# Patient Record
Sex: Male | Born: 1980 | Race: White | Hispanic: Yes | Marital: Married | Smoking: Former smoker
Health system: Southern US, Community
[De-identification: ages and names within clinical notes are randomized; demographics above are authoritative.]

---

## 2006-11-24 HISTORY — PX: HERNIA REPAIR: SHX51

## 2007-02-24 ENCOUNTER — Ambulatory Visit (HOSPITAL_COMMUNITY): Admission: RE | Admit: 2007-02-24 | Discharge: 2007-02-24 | Payer: Self-pay | Admitting: Preventative Medicine

## 2007-06-08 ENCOUNTER — Encounter (INDEPENDENT_AMBULATORY_CARE_PROVIDER_SITE_OTHER): Payer: Self-pay | Admitting: Surgery

## 2007-06-08 ENCOUNTER — Ambulatory Visit (HOSPITAL_COMMUNITY): Admission: RE | Admit: 2007-06-08 | Discharge: 2007-06-08 | Payer: Self-pay | Admitting: Surgery

## 2011-02-27 ENCOUNTER — Emergency Department (HOSPITAL_BASED_OUTPATIENT_CLINIC_OR_DEPARTMENT_OTHER)
Admission: EM | Admit: 2011-02-27 | Discharge: 2011-02-27 | Disposition: A | Discharge status: Home / Self Care | Payer: Worker's Compensation | Attending: Emergency Medicine | Admitting: Emergency Medicine

## 2011-02-27 DIAGNOSIS — S61209A Unspecified open wound of unspecified finger without damage to nail, initial encounter: Secondary | ICD-10-CM | POA: Insufficient documentation

## 2011-02-27 DIAGNOSIS — Y9289 Other specified places as the place of occurrence of the external cause: Secondary | ICD-10-CM | POA: Insufficient documentation

## 2011-02-27 DIAGNOSIS — W268XXA Contact with other sharp object(s), not elsewhere classified, initial encounter: Secondary | ICD-10-CM | POA: Insufficient documentation

## 2011-03-07 ENCOUNTER — Emergency Department (HOSPITAL_BASED_OUTPATIENT_CLINIC_OR_DEPARTMENT_OTHER)
Admission: EM | Admit: 2011-03-07 | Discharge: 2011-03-07 | Disposition: A | Discharge status: Home / Self Care | Payer: Worker's Compensation | Attending: Emergency Medicine | Admitting: Emergency Medicine

## 2011-03-07 DIAGNOSIS — Z4802 Encounter for removal of sutures: Secondary | ICD-10-CM | POA: Insufficient documentation

## 2011-04-08 NOTE — Op Note (Signed)
NAME:  Dave Gonzales, Dave Gonzales        ACCOUNT NO.:  [id number]   MEDICAL RECORD NO.:  [id number]          PATIENT TYPE:  AMB   LOCATION:  SDS                          FACILITY:  [facility]   PHYSICIAN:  Thomas A. Cornett, M.D.DATE OF BIRTH:  1981/03/19   DATE OF PROCEDURE:  06/08/2007  DATE OF DISCHARGE:                               OPERATIVE REPORT   PREOPERATIVE DIAGNOSIS:  Abdominal wall mass measuring 2 cm.   POSTOP DIAGNOSES:  1. A 2 cm abdominal wall mass just to the right of the umbilicus.  2. A 4-to-5-mm umbilical hernia.   PROCEDURE:  1. Repair of umbilical hernia.  2. Excision of a 2 cm subcutaneous, anterior, abdominal wall mass just      the right of the umbilicus.   SURGEON:  Maisie Fus A. Cornett, M.D.   ASSISTANT:  None.   ANESTHESIA:  General endotracheal anesthesia with 0.25% Sensorcaine with  epinephrine local.   ESTIMATED BLOOD LOSS:  10 mL.   SPECIMEN:  Fatty mass from anterior abdominal wall fascia to pathology.   INDICATIONS FOR PROCEDURE:  The patient is a 30 year old Hispanic male  whom I saw back in April.  He is complaining of some periumbilical pain,  and had a very hard nodule just to the right of his umbilicus at that  time.  I could not palpate any evidence of hernia with a Valsalva, and  felt this was secondary to trauma since he felt this during work; and  gave a history of trauma to this area.  He presents 2 months later for  excision of this area.  The reason for the delay in him getting  scheduled for surgery is unclear.  I examined him in the holding area;  and the fullness was much less than it was in the office, but was still  present and somewhat uncomfortable.  An informed consent was obtained,  in the office, with the translator as well as today.   DESCRIPTION OF PROCEDURE:  The patient was brought to the operating room  and placed supine.  After induction of general anesthesia, the abdomen  was prepped and draped in a sterile fashion.  The  area of interest was  approximately 1 cm to 2 cm to the right of the umbilicus.  With him  asleep, though, I got a good exam of his umbilicus; and he did now what  was felt to be a 4-to-5-mm umbilical defect consistent with an umbilical  hernia.  Just to the right of his umbilicus was an area of fullness  where the previous mass was palpated which was smaller, but was still  present.  In any event, it was unclear to me exactly what was causing  his pain, but I felt that repair of his hernia since we would be working  in the same area was warranted, since this may also be contributing to  his discomfort.   The incision that I used was to the right along the lateral border of  the umbilicus, along the curve of the umbilicus.  I then elevated the  umbilicus off the fascia with a scalpel.  Indeed, there was a  roughly 5-  mm defect.  There was no fat or anything caught in this, but it was  present.  I felt that repair of this would be warranted; and it was so  small I used a single stitch of #1 Novofil after grabbing both edges of  the fascia and elevating this.  A single stitch was placed carefully to  close this defect.  This was done without difficulty.  The area just  lateral to his umbilicus on the right was somewhat hard and full, almost  consistent with an area of fat necrosis.  I excised this area with the  cautery; and it was on the anterior surface of the abdominal wall  consistent with either a traumatic injury, less likely a lipoma.  Then 2  cm of fatty, somewhat scar tissue removed without difficulty.  I did not  feel any other defect or other abnormality in this area.  This  corresponded to his physical exam earlier.   I then closed the wound using a 3-0 Vicryl to secure the umbilicus down  to the fascia, and then a 4-0 Monocryl to close the skin in a  subcuticular fashion.  I placed a small cotton ball into the defect and  used Steri-Strips.  Dry dressings were applied.  All  final counts of  sponge, needle, and instruments were found to be correct in this portion  of the case.  The patient was awoke, taken to recovery in satisfactory  condition.      Thomas A. Cornett, M.D.  Electronically Signed     TAC/MEDQ  D:  06/08/2007  T:  06/08/2007  Job:  [id number]   cc:   Dorann Lodge

## 2011-09-09 LAB — BASIC METABOLIC PANEL
BUN: 10
GFR calc Af Amer: >60
GFR calc non Af Amer: >60
Potassium: 4.3

## 2011-09-09 LAB — DIFFERENTIAL
Eosinophils Relative: 5
Lymphocytes Relative: 29
Lymphs Abs: 3.1
Neutrophils Relative %: 52

## 2011-09-09 LAB — CBC
HCT: 47.5
Platelets: 278
RBC: 5.4
WBC: 10.5

## 2016-02-27 ENCOUNTER — Emergency Department (HOSPITAL_COMMUNITY)
Admission: EM | Admit: 2016-02-27 | Discharge: 2016-02-27 | Disposition: A | Discharge status: Home / Self Care | Payer: No Typology Code available for payment source | Attending: Emergency Medicine | Admitting: Emergency Medicine

## 2016-02-27 ENCOUNTER — Encounter (HOSPITAL_COMMUNITY): Payer: Self-pay

## 2016-02-27 ENCOUNTER — Emergency Department (HOSPITAL_COMMUNITY): Payer: No Typology Code available for payment source

## 2016-02-27 DIAGNOSIS — M545 Low back pain, unspecified: Secondary | ICD-10-CM

## 2016-02-27 DIAGNOSIS — Y9389 Activity, other specified: Secondary | ICD-10-CM | POA: Insufficient documentation

## 2016-02-27 DIAGNOSIS — S3992XA Unspecified injury of lower back, initial encounter: Secondary | ICD-10-CM | POA: Insufficient documentation

## 2016-02-27 DIAGNOSIS — Y998 Other external cause status: Secondary | ICD-10-CM | POA: Insufficient documentation

## 2016-02-27 DIAGNOSIS — S29001A Unspecified injury of muscle and tendon of front wall of thorax, initial encounter: Secondary | ICD-10-CM | POA: Diagnosis present

## 2016-02-27 DIAGNOSIS — Y92410 Unspecified street and highway as the place of occurrence of the external cause: Secondary | ICD-10-CM | POA: Diagnosis not present

## 2016-02-27 DIAGNOSIS — R0781 Pleurodynia: Secondary | ICD-10-CM

## 2016-02-27 NOTE — ED Notes (Signed)
Patient able to ambulate independentlty

## 2016-02-27 NOTE — Discharge Instructions (Signed)
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
## 2016-02-27 NOTE — ED Notes (Signed)
Registration at bedside.
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
## 2019-08-31 ENCOUNTER — Telehealth: Payer: Self-pay

## 2019-08-31 NOTE — Telephone Encounter (Signed)
Pt. Eligibility is 08/31/2019 till 08/30/2020 with [facility]. Did not fax over pt. [employer]ist application because we still need pt. [employer]. Will leave another note once we receive pt. [employer] [place]

## 2019-09-08 ENCOUNTER — Encounter: Payer: Self-pay | Admitting: Student

## 2019-09-08 ENCOUNTER — Ambulatory Visit: Payer: Self-pay | Admitting: Physician Assistant

## 2019-09-08 ENCOUNTER — Other Ambulatory Visit: Payer: Self-pay

## 2019-09-08 VITALS — BP 120/80 | HR 72 | Temp 99.3°F

## 2019-09-08 DIAGNOSIS — Z131 Encounter for screening for diabetes mellitus: Secondary | ICD-10-CM

## 2019-09-08 DIAGNOSIS — Z8719 Personal history of other diseases of the digestive system: Secondary | ICD-10-CM

## 2019-09-08 DIAGNOSIS — Z789 Other specified health status: Secondary | ICD-10-CM

## 2019-09-08 DIAGNOSIS — Z7689 Persons encountering health services in other specified circumstances: Secondary | ICD-10-CM

## 2019-09-08 DIAGNOSIS — Z13220 Encounter for screening for lipoid disorders: Secondary | ICD-10-CM

## 2019-09-08 DIAGNOSIS — R1905 Periumbilic swelling, mass or lump: Secondary | ICD-10-CM

## 2019-09-08 DIAGNOSIS — R1084 Generalized abdominal pain: Secondary | ICD-10-CM

## 2019-09-08 NOTE — Patient Instructions (Addendum)
cita para el ultrasonido martes 20 de Oct. 2020 a las 8:15am. NADA DE COMER NI BEBER DESPUES DE LAS 12 am LA NOCHE ANTES DE SU CITA.  sacarse sangre en el laboratorio mientras este ayunando

## 2019-09-08 NOTE — Progress Notes (Signed)
BP 120/80   Pulse 72   Temp 99.3 F (37.4 C)   SpO2 97%    Subjective:    Patient ID: Dave Gonzales, male    DOB: 1980/12/23, 38 y.o.   MRN: [id number]  HPI: Dave Gonzales is a 38 y.o. male presenting on 09/08/2019 for New Patient (Initial Visit) (pt states he hasn't ever had a PCP) and Abdominal Pain (for about a yea. pt states when he straightens up after being bent over he feels pain. pt states he also has pain after he eats. pt states it is on his lower abd. pt states he has not tried taking anything to help with pain)   HPI   Pt had a negative covid 19 screening questionnaire   Pt works mfg making containers (gallon size)  Pt has abdominal pain that comes and goes.  abd hurts when he bends and lifts.  He does not have pain when sitting around at home at rest.  Pt had hernia surgery in 2008 in [place].    Relevant past medical, surgical, family and social history reviewed and updated as indicated. Interim medical history since our last visit reviewed. Allergies and medications reviewed and updated.  No current outpatient medications on file.    Review of Systems  Per HPI unless specifically indicated above     Objective:    BP 120/80   Pulse 72   Temp 99.3 F (37.4 C)   SpO2 97%   Wt Readings from Last 3 Encounters:  02/27/16 198 lb (89.8 kg)    Physical Exam Vitals signs reviewed.  Constitutional:      General: He is not in acute distress.    Appearance: He is well-developed. He is not ill-appearing.  HENT:     Head: Normocephalic and atraumatic.  Eyes:     Conjunctiva/sclera: Conjunctivae normal.     Pupils: Pupils are equal, round, and reactive to light.  Neck:     Musculoskeletal: Neck supple.     Thyroid: No thyromegaly.  Cardiovascular:     Rate and Rhythm: Normal rate and regular rhythm.  Pulmonary:     Effort: Pulmonary effort is normal.     Breath sounds: Normal breath sounds. No wheezing or rales.  Abdominal:   General: Abdomen is flat. A surgical scar is present. Bowel sounds are normal. There is no distension.     Palpations: Abdomen is soft. There is no mass.     Tenderness: There is generalized abdominal tenderness. There is no guarding or rebound.     Comments: Pt has very mild generalized tenderness.  There is well healed surgical scar next to the umbilicus.  There is mass approximately 1 cm adjacent to the umbilicus. It is nontender and not red. No drainage.  No hernia appreciated.   Lymphadenopathy:     Cervical: No cervical adenopathy.  Skin:    General: Skin is warm and dry.     Findings: No rash.  Neurological:     Mental Status: He is alert and oriented to person, place, and time.  Psychiatric:        Attention and Perception: Attention normal.        Speech: Speech normal.        Behavior: Behavior normal. Behavior is cooperative.         Assessment & Plan:    Encounter Diagnoses  Name Primary?  . Encounter to establish care Yes  . Generalized abdominal pain   . Periumbilical mass   .  Screening cholesterol level   . Screening for diabetes mellitus   . History of abdominal hernia   . Not proficient in English language      -will Check lipids, a1c, cmp -will get [place] abd.  Discussed with pt that mass is consistency of lipoma.  [place] will help verify this and evaluate for hernia.  -pt is given application for [facility] financial assistance -Will plan to refer to surgery after review of [place] -Pt already got influenza immunization -pt to follow up 1 month (telemedicine appointment).  He is to contact office sooner for worsening or new symtpoms

## 2019-09-13 ENCOUNTER — Other Ambulatory Visit (HOSPITAL_COMMUNITY)
Admission: RE | Admit: 2019-09-13 | Discharge: 2019-09-13 | Disposition: A | Discharge status: Home / Self Care | Payer: Self-pay | Source: Ambulatory Visit | Attending: Physician Assistant | Admitting: Physician Assistant

## 2019-09-13 ENCOUNTER — Ambulatory Visit (HOSPITAL_COMMUNITY)
Admission: RE | Admit: 2019-09-13 | Discharge: 2019-09-13 | Disposition: A | Discharge status: Home / Self Care | Payer: Self-pay | Source: Ambulatory Visit | Attending: Physician Assistant | Admitting: Physician Assistant

## 2019-09-13 ENCOUNTER — Other Ambulatory Visit: Payer: Self-pay

## 2019-09-13 DIAGNOSIS — Z13220 Encounter for screening for lipoid disorders: Secondary | ICD-10-CM | POA: Insufficient documentation

## 2019-09-13 DIAGNOSIS — R1084 Generalized abdominal pain: Secondary | ICD-10-CM | POA: Insufficient documentation

## 2019-09-13 DIAGNOSIS — Z131 Encounter for screening for diabetes mellitus: Secondary | ICD-10-CM | POA: Insufficient documentation

## 2019-09-13 DIAGNOSIS — R1905 Periumbilic swelling, mass or lump: Secondary | ICD-10-CM | POA: Insufficient documentation

## 2019-09-13 DIAGNOSIS — Z8719 Personal history of other diseases of the digestive system: Secondary | ICD-10-CM | POA: Insufficient documentation

## 2019-09-13 LAB — COMPREHENSIVE METABOLIC PANEL
ALT: 58 U/L — ABNORMAL HIGH (ref 0–44)
AST: 37 U/L (ref 15–41)
Albumin: 4.1 g/dL (ref 3.5–5.0)
Alkaline Phosphatase: 75 U/L (ref 38–126)
Anion gap: 9 (ref 5–15)
BUN: 18 mg/dL (ref 6–20)
CO2: 26 mmol/L (ref 22–32)
Calcium: 9 mg/dL (ref 8.9–10.3)
Chloride: 104 mmol/L (ref 98–111)
Creatinine, Ser: 0.6 mg/dL — ABNORMAL LOW (ref 0.61–1.24)
GFR calc Af Amer: >60 mL/min (ref >60)
GFR calc non Af Amer: >60 mL/min (ref >60)
Glucose, Bld: 103 mg/dL — ABNORMAL HIGH (ref 70–99)
Potassium: 3.7 mmol/L (ref 3.5–5.1)
Sodium: 139 mmol/L (ref 135–145)
Total Bilirubin: 1.5 mg/dL — ABNORMAL HIGH (ref 0.3–1.2)
Total Protein: 7.2 g/dL (ref 6.5–8.1)

## 2019-09-13 LAB — HEMOGLOBIN A1C
Hgb A1c MFr Bld: 6.1 % — ABNORMAL HIGH (ref 4.8–5.6)
Mean Plasma Glucose: 128.37 mg/dL

## 2019-09-13 LAB — LIPID PANEL
Cholesterol: 174 mg/dL (ref 0–200)
HDL: 43 mg/dL (ref >40)
LDL Cholesterol: 87 mg/dL (ref 0–99)
Total CHOL/HDL Ratio: 4 RATIO
Triglycerides: 218 mg/dL — ABNORMAL HIGH (ref <150)
VLDL: 44 mg/dL — ABNORMAL HIGH (ref 0–40)

## 2019-10-10 ENCOUNTER — Ambulatory Visit: Payer: Self-pay | Admitting: Physician Assistant

## 2020-08-30 ENCOUNTER — Ambulatory Visit: Payer: Self-pay | Admitting: Physician Assistant

## 2020-09-13 ENCOUNTER — Ambulatory Visit: Payer: Self-pay | Admitting: Physician Assistant

## 2020-09-17 ENCOUNTER — Emergency Department

## 2020-09-17 ENCOUNTER — Emergency Department
Admission: EM | Admit: 2020-09-17 | Discharge: 2020-09-17 | Disposition: A | Discharge status: Home / Self Care | Attending: Emergency Medicine | Admitting: Emergency Medicine

## 2020-09-17 ENCOUNTER — Other Ambulatory Visit: Payer: Self-pay

## 2020-09-17 DIAGNOSIS — Z23 Encounter for immunization: Secondary | ICD-10-CM | POA: Diagnosis not present

## 2020-09-17 DIAGNOSIS — S62631B Displaced fracture of distal phalanx of left index finger, initial encounter for open fracture: Secondary | ICD-10-CM

## 2020-09-17 DIAGNOSIS — W231XXA Caught, crushed, jammed, or pinched between stationary objects, initial encounter: Secondary | ICD-10-CM | POA: Diagnosis not present

## 2020-09-17 DIAGNOSIS — Z87891 Personal history of nicotine dependence: Secondary | ICD-10-CM | POA: Insufficient documentation

## 2020-09-17 DIAGNOSIS — S62631A Displaced fracture of distal phalanx of left index finger, initial encounter for closed fracture: Secondary | ICD-10-CM | POA: Insufficient documentation

## 2020-09-17 DIAGNOSIS — S61211A Laceration without foreign body of left index finger without damage to nail, initial encounter: Secondary | ICD-10-CM | POA: Diagnosis not present

## 2020-09-17 DIAGNOSIS — S67191A Crushing injury of left index finger, initial encounter: Secondary | ICD-10-CM | POA: Diagnosis present

## 2020-09-17 DIAGNOSIS — Y990 Civilian activity done for income or pay: Secondary | ICD-10-CM | POA: Insufficient documentation

## 2020-09-17 DIAGNOSIS — S6710XA Crushing injury of unspecified finger(s), initial encounter: Secondary | ICD-10-CM

## 2020-09-17 DIAGNOSIS — S61321A Laceration with foreign body of left index finger with damage to nail, initial encounter: Secondary | ICD-10-CM

## 2020-09-17 MED ORDER — LIDOCAINE HCL (PF) 1 % IJ SOLN
5.0000 mL | Freq: Once | INTRAMUSCULAR | Status: AC
  Administered 2020-09-17: 5 mL via INTRADERMAL
  Filled 2020-09-17: qty 5

## 2020-09-17 MED ORDER — TETANUS-DIPHTH-ACELL PERTUSSIS 5-2.5-18.5 LF-MCG/0.5 IM SUSP
INTRAMUSCULAR | Status: AC
  Filled 2020-09-17: qty 0.5

## 2020-09-17 MED ORDER — MELOXICAM 15 MG PO TABS: 15.0000 mg | ORAL_TABLET | Freq: Every day | ORAL | 0 refills | Status: DC

## 2020-09-17 MED ORDER — BUPIVACAINE HCL (PF) 0.5 % IJ SOLN
10.0000 mL | Freq: Once | INTRAMUSCULAR | Status: AC
  Administered 2020-09-17: 10 mL
  Filled 2020-09-17: qty 10

## 2020-09-17 MED ORDER — OXYCODONE-ACETAMINOPHEN 10-325 MG PO TABS: 1.0000 | ORAL_TABLET | Freq: Four times a day (QID) | ORAL | 0 refills | Status: DC | PRN

## 2020-09-17 MED ORDER — SULFAMETHOXAZOLE-TRIMETHOPRIM 800-160 MG PO TABS: 1.0000 | ORAL_TABLET | Freq: Two times a day (BID) | ORAL | 0 refills | Status: DC

## 2020-09-17 MED ORDER — TETANUS-DIPHTH-ACELL PERTUSSIS 5-2.5-18.5 LF-MCG/0.5 IM SUSY
0.5000 mL | PREFILLED_SYRINGE | Freq: Once | INTRAMUSCULAR | Status: AC
  Administered 2020-09-17: 0.5 mL via INTRAMUSCULAR

## 2020-09-17 MED ORDER — CEPHALEXIN 500 MG PO CAPS: 500.0000 mg | ORAL_CAPSULE | Freq: Three times a day (TID) | ORAL | 0 refills | Status: AC

## 2020-09-17 NOTE — ED Provider Notes (Signed)
[facility] Emergency Department Provider Note  ____________________________________________  Time seen: Approximately 1:46 PM  I have reviewed the triage vital signs and the nursing notes.   HISTORY  Chief Complaint Finger Injury   HPI Dave Gonzales is a 39 y.o. male presenting to the emergency department after accidentally getting his left index finger crushed in machinery while at work. Obvious deformity. Laceration present as well. Bleeding is well controlled. Tdap is not current   History reviewed. No pertinent past medical history.  There are no problems to display for this patient.   Past Surgical History:  Procedure Laterality Date  . HERNIA REPAIR  2008    Prior to Admission medications   Medication Sig Start Date End Date Taking? Authorizing Provider  cephALEXin (KEFLEX) 500 MG capsule Take 1 capsule (500 mg total) by mouth 3 (three) times daily for 10 days. 09/17/20 09/27/20  Emberlynn Riggan, Rulon Eisenmenger B, FNP  meloxicam (MOBIC) 15 MG tablet Take 1 tablet (15 mg total) by mouth daily. 09/17/20   Mcihael Hinderman, Rulon Eisenmenger B, FNP  oxyCODONE-acetaminophen (PERCOCET) 10-325 MG tablet Take 1 tablet by mouth every 6 (six) hours as needed for pain. 09/17/20 09/17/21  Josedejesus Marcum, Rulon Eisenmenger B, FNP  sulfamethoxazole-trimethoprim (BACTRIM DS) 800-160 MG tablet Take 1 tablet by mouth 2 (two) times daily. 09/17/20   Chinita Pester, FNP    Allergies Patient has no known allergies.  Family History  Problem Relation Age of Onset  . Hyperlipidemia Mother   . Heart attack Father   . Diabetes Father     Social History Social History   Tobacco Use  . Smoking status: Former Smoker    Quit date: 06/25/2019    Years since quitting: 1.2  . Smokeless tobacco: Never Used  . Tobacco comment: previously social smoker.  quit smoking 06-25-2019  Vaping Use  . Vaping Use: Never used  Substance Use Topics  . Alcohol use: Yes    Comment: drinks 1-2 beer every other week  . Drug  use: Never    Review of Systems  Constitutional: Negative for fever. Respiratory: Negative for cough or shortness of breath.  Musculoskeletal: Negative for myalgias Skin: Positive for laceration over the left index finger. Neurological: Negative for numbness or paresthesias. ____________________________________________   PHYSICAL EXAM:  VITAL SIGNS: ED Triage Vitals  Enc Vitals Group     BP 09/17/20 1119 132/79     Pulse Rate 09/17/20 1119 88     Resp 09/17/20 1119 16     Temp 09/17/20 1119 99.5 F (37.5 C)     Temp Source 09/17/20 1119 Oral     SpO2 09/17/20 1119 (!) 79 %     Weight --      Height --      Head Circumference --      Peak Flow --      Pain Score 09/17/20 1330 0     Pain Loc --      Pain Edu? --      Excl. in GC? --      Constitutional: Well appearing. Eyes: Conjunctivae are clear without discharge or drainage. Nose: No rhinorrhea noted. Mouth/Throat: Airway is patent.  Neck: No stridor. Unrestricted range of motion observed. Cardiovascular: Capillary refill is <3 seconds.  Respiratory: Respirations are even and unlabored.. Musculoskeletal: Deformity of the left index finger at the distal tuft. No bony exposure. Neurologic: Awake, alert, and oriented x 4.  Skin: Laceration over the distal index finger. Fingernail displaced at the base on the medial side.  ____________________________________________   LABS (all labs ordered are listed, but only abnormal results are displayed)  Labs Reviewed - No data to display ____________________________________________  EKG  Not indicated. ____________________________________________  RADIOLOGY  Displaced, comminuted distal tuft fracture left index finger.  Image viewed by me. ____________________________________________   PROCEDURES  .Marland KitchenLaceration Repair  Date/Time: 09/17/2020 2:07 PM Performed by: Chinita Pester, FNP Authorized by: Chinita Pester, FNP   Consent:    Consent obtained:   Verbal   Consent given by:  Patient   Risks discussed:  Need for additional repair, infection and poor wound healing Anesthesia (see MAR for exact dosages):    Anesthesia method:  Nerve block   Block needle gauge:  25 G   Block anesthetic:  Bupivacaine 0.5% w/o epi and lidocaine 1% w/o epi   Block injection procedure:  Introduced needle   Block outcome:  Anesthesia achieved Laceration details:    Location:  Finger   Finger location:  L index finger   Length (cm):  3.5 Repair type:    Repair type:  Intermediate Pre-procedure details:    Preparation:  Patient was prepped and draped in usual sterile fashion Exploration:    Contaminated: yes   Treatment:    Area cleansed with:  Betadine and saline   Irrigation method:  Syringe Skin repair:    Repair method:  Sutures   Suture size:  4-0   Suture material:  Nylon   Suture technique:  Simple interrupted   Number of sutures:  8 Approximation:    Approximation:  Close Post-procedure details:    Dressing:  Non-adherent dressing, sterile dressing and splint for protection   Patient tolerance of procedure:  Tolerated well, no immediate complications Comments:     Base of nail reinserted and secured with suture.   ____________________________________________   INITIAL IMPRESSION / ASSESSMENT AND PLAN / ED COURSE  Dave Gonzales is a 39 y.o. male presenting to the emergency department for treatment and evaluation after his finger was smashed in a piece of machinery while at work this morning.  See HPI for further details.  Wound cleaned and repaired as described above.  Splint was applied.  Wound care discussed.  Patient is to call Dr. Binnie Rail office this afternoon to request an appointment. He will be double covered with Keflex and Bactrim and given pain meds and anti-inflammatory.  Tdap booster given. He to return to the ER for concerns if unable to see PCP or orthopedics.   Medications  Tdap (BOOSTRIX) 5-2.5-18.5 LF-MCG/0.5  injection ( Intramuscular Not Given 09/17/20 1206)  lidocaine (PF) (XYLOCAINE) 1 % injection 5 mL (5 mLs Intradermal Given 09/17/20 1202)  bupivacaine (MARCAINE) 0.5 % injection 10 mL (10 mLs Infiltration Given 09/17/20 1202)  Tdap (BOOSTRIX) injection 0.5 mL (0.5 mLs Intramuscular Given 09/17/20 1204)     Pertinent labs & imaging results that were available during my care of the patient were reviewed by me and considered in my medical decision making (see chart for details).  ____________________________________________   FINAL CLINICAL IMPRESSION(S) / ED DIAGNOSES  Final diagnoses:  Crushing injury of finger, initial encounter  Laceration of left index finger with foreign body and damage to nail, initial encounter  Open displaced fracture of distal phalanx of left index finger, initial encounter    ED Discharge Orders         Ordered    oxyCODONE-acetaminophen (PERCOCET) 10-325 MG tablet  Every 6 hours PRN        09/17/20 1301  cephALEXin (KEFLEX) 500 MG capsule  3 times daily        09/17/20 1301    sulfamethoxazole-trimethoprim (BACTRIM DS) 800-160 MG tablet  2 times daily        09/17/20 1301    meloxicam (MOBIC) 15 MG tablet  Daily        09/17/20 1301           Note:  This document was prepared using Dragon voice recognition software and may include unintentional dictation errors.   Chinita Pester, FNP 09/17/20 1417    Minna Antis, MD 09/17/20 1442

## 2020-09-17 NOTE — ED Notes (Signed)
Spoke with [employer] about the need for a ETOCH blood draw. Barbara Cower said pt does not need this despite what profile says.  Barbara Cower asked to be called to pick up the pt.   lw edt

## 2020-09-17 NOTE — ED Triage Notes (Signed)
Pt states he got his left pointer finger crushed in a machine today while at work, deformity noted with controlled bleeding.

## 2020-09-17 NOTE — Discharge Instructions (Signed)
Please call Dr. Binnie Rail office to schedule an appointment.  Take the antibiotic as prescribed and until finished.   Take the pain medication for severe pain. Do not drive or use machinery while taking it as it will cause drowsiness or dizziness.  Return to the ER for symptoms of concern if unable to see PCP or orthopedics.

## 2020-09-20 ENCOUNTER — Ambulatory Visit: Payer: Self-pay | Admitting: Physician Assistant

## 2020-10-01 ENCOUNTER — Encounter: Payer: Self-pay | Admitting: Physician Assistant

## 2020-10-15 ENCOUNTER — Encounter: Payer: Self-pay | Admitting: Physician Assistant

## 2020-10-15 ENCOUNTER — Ambulatory Visit: Payer: Self-pay | Admitting: Physician Assistant

## 2020-10-15 VITALS — BP 122/80 | HR 88 | Temp 97.3°F | Ht 66.5 in | Wt 198.0 lb

## 2020-10-15 DIAGNOSIS — R7303 Prediabetes: Secondary | ICD-10-CM

## 2020-10-15 DIAGNOSIS — Z789 Other specified health status: Secondary | ICD-10-CM

## 2020-10-15 DIAGNOSIS — R1030 Lower abdominal pain, unspecified: Secondary | ICD-10-CM

## 2020-10-15 DIAGNOSIS — E785 Hyperlipidemia, unspecified: Secondary | ICD-10-CM

## 2020-10-15 DIAGNOSIS — R109 Unspecified abdominal pain: Secondary | ICD-10-CM

## 2020-10-15 DIAGNOSIS — R102 Pelvic and perineal pain: Secondary | ICD-10-CM

## 2020-10-15 NOTE — Progress Notes (Signed)
BP 122/80   Pulse 88   Temp (!) 97.3 F (36.3 C)   Ht 5' 6.5" (1.689 m)   Wt 198 lb (89.8 kg)   SpO2 96%   BMI 31.48 kg/m    Subjective:    Patient ID: Dave Gonzales, male    DOB: Nov 08, 1981, 39 y.o.   MRN: [id number]  HPI: Dave Gonzales is a 39 y.o. male presenting on 10/15/2020 for New Patient (Initial Visit) (pt is working at [employer]) and Mass (pain R side rib. pt states he feels a raised area when he rubs i and it hurts. pt states he has felt it for about 2-3 months. pt started noticing it after falling at work)   HPI   Pt had a negative covid 19 screening questionnaire.   Chief Complaint  Patient presents with  . New Patient (Initial Visit)    pt is working at [employer]  . Mass    pain R side rib. pt states he feels a raised area when he rubs i and it hurts. pt states he has felt it for about 2-3 months. pt started noticing it after falling at work     Pt is 39yoM who presents to re-establish care.  He was last seen here 09/08/2019.  He was a no-show to his follow up in November.  He recently had an injury to his finger. He is still under care with orthopedics for his finger crush injury .  He hurts R side/rib and abdomen.  He says it is the same as he had when seen here 08/2019.  He had imaging but never returned to office.    He says his abdominal pain is worse than his rib.  He says he has No pain when sitting.   He experiences Pain with leaning forward, laying back, standing up.    He is Eating okay.  His BMs are normal and regular.        Relevant past medical, surgical, family and social history reviewed and updated as indicated. Interim medical history since our last visit reviewed. Allergies and medications reviewed and updated.   No current outpatient medications on file.     Review of Systems  Per HPI unless specifically indicated above     Objective:    BP 122/80   Pulse 88   Temp (!) 97.3 F (36.3 C)   Ht 5'  6.5" (1.689 m)   Wt 198 lb (89.8 kg)   SpO2 96%   BMI 31.48 kg/m   Wt Readings from Last 3 Encounters:  10/15/20 198 lb (89.8 kg)  02/27/16 198 lb (89.8 kg)    Physical Exam Vitals reviewed.  Constitutional:      General: He is not in acute distress.    Appearance: He is well-developed. He is not ill-appearing.  HENT:     Head: Normocephalic and atraumatic.  Eyes:     Conjunctiva/sclera: Conjunctivae normal.     Pupils: Pupils are equal, round, and reactive to light.  Neck:     Thyroid: No thyromegaly.  Cardiovascular:     Rate and Rhythm: Normal rate and regular rhythm.  Pulmonary:     Effort: Pulmonary effort is normal.     Breath sounds: Normal breath sounds. No wheezing or rales.  Abdominal:     General: Bowel sounds are normal.     Palpations: Abdomen is soft. There is no mass.     Tenderness: There is abdominal tenderness in the suprapubic area.  Hernia: There is no hernia in the left inguinal area or right inguinal area.    Musculoskeletal:     Cervical back: Neck supple.     Right lower leg: No edema.     Left lower leg: No edema.  Lymphadenopathy:     Cervical: No cervical adenopathy.     Lower Body: No right inguinal adenopathy. No left inguinal adenopathy.  Skin:    General: Skin is warm and dry.     Findings: No rash.  Neurological:     Mental Status: He is alert and oriented to person, place, and time.     Motor: No weakness or tremor.     Gait: Gait is intact.  Psychiatric:        Attention and Perception: Attention normal.        Speech: Speech normal.     Comments: Pt becomes angry when asked to move on from talking about his finger injury for which he is already being treated.             Assessment & Plan:    Encounter Diagnoses  Name Primary?  . Not proficient in English language Yes  . Suprapubic abdominal pain   . Prediabetes   . Hyperlipidemia, unspecified hyperlipidemia type   . Lower abdominal pain      -will Update  labs -Will order CT to check for hernia/mass as [place] was unremarkable (due to technical difficulties with order entry, nurse will enter after discussing with helpline) -pt is given CAFA/application for [employer] financial assistance -pt to follow up 1 month to review labs, imaging and discuss ribs.  He is to contact office sooner prn

## 2020-10-16 ENCOUNTER — Other Ambulatory Visit (HOSPITAL_COMMUNITY): Payer: Self-pay | Admitting: Physician Assistant

## 2020-10-16 ENCOUNTER — Other Ambulatory Visit: Payer: Self-pay | Admitting: Physician Assistant

## 2020-10-16 DIAGNOSIS — R109 Unspecified abdominal pain: Secondary | ICD-10-CM

## 2020-10-23 ENCOUNTER — Other Ambulatory Visit (HOSPITAL_COMMUNITY)
Admission: RE | Admit: 2020-10-23 | Discharge: 2020-10-23 | Disposition: A | Discharge status: Home / Self Care | Payer: Self-pay | Source: Ambulatory Visit | Attending: Physician Assistant | Admitting: Physician Assistant

## 2020-10-23 DIAGNOSIS — R7303 Prediabetes: Secondary | ICD-10-CM | POA: Insufficient documentation

## 2020-10-23 DIAGNOSIS — E785 Hyperlipidemia, unspecified: Secondary | ICD-10-CM | POA: Insufficient documentation

## 2020-10-23 LAB — HEMOGLOBIN A1C
Hgb A1c MFr Bld: 5.8 % — ABNORMAL HIGH (ref 4.8–5.6)
Mean Plasma Glucose: 119.76 mg/dL

## 2020-10-23 LAB — COMPREHENSIVE METABOLIC PANEL
ALT: 43 U/L (ref 0–44)
AST: 26 U/L (ref 15–41)
Albumin: 4 g/dL (ref 3.5–5.0)
Alkaline Phosphatase: 65 U/L (ref 38–126)
Anion gap: 7 (ref 5–15)
BUN: 13 mg/dL (ref 6–20)
CO2: 27 mmol/L (ref 22–32)
Calcium: 9 mg/dL (ref 8.9–10.3)
Chloride: 104 mmol/L (ref 98–111)
Creatinine, Ser: 0.66 mg/dL (ref 0.61–1.24)
GFR, Estimated: >60 mL/min (ref >60)
Glucose, Bld: 106 mg/dL — ABNORMAL HIGH (ref 70–99)
Potassium: 3.9 mmol/L (ref 3.5–5.1)
Sodium: 138 mmol/L (ref 135–145)
Total Bilirubin: 1.1 mg/dL (ref 0.3–1.2)
Total Protein: 7.1 g/dL (ref 6.5–8.1)

## 2020-10-23 LAB — LIPID PANEL
Cholesterol: 169 mg/dL (ref 0–200)
HDL: 48 mg/dL (ref >40)
LDL Cholesterol: 109 mg/dL — ABNORMAL HIGH (ref 0–99)
Total CHOL/HDL Ratio: 3.5 RATIO
Triglycerides: 60 mg/dL (ref <150)
VLDL: 12 mg/dL (ref 0–40)

## 2020-11-14 ENCOUNTER — Other Ambulatory Visit: Payer: Self-pay

## 2020-11-14 ENCOUNTER — Ambulatory Visit (HOSPITAL_COMMUNITY)
Admission: RE | Admit: 2020-11-14 | Discharge: 2020-11-14 | Disposition: A | Discharge status: Home / Self Care | Payer: Self-pay | Source: Ambulatory Visit | Attending: Physician Assistant | Admitting: Physician Assistant

## 2020-11-14 DIAGNOSIS — R109 Unspecified abdominal pain: Secondary | ICD-10-CM

## 2020-11-14 MED ORDER — BARIUM SULFATE 2.1 % PO SUSP
ORAL | Status: AC
  Filled 2020-11-14: qty 2

## 2020-11-26 ENCOUNTER — Ambulatory Visit: Payer: Self-pay | Admitting: Physician Assistant

## 2020-12-11 ENCOUNTER — Ambulatory Visit (HOSPITAL_COMMUNITY): Payer: Self-pay

## 2020-12-20 ENCOUNTER — Other Ambulatory Visit: Payer: Self-pay

## 2020-12-20 ENCOUNTER — Ambulatory Visit: Payer: Self-pay | Admitting: Physician Assistant

## 2020-12-20 ENCOUNTER — Ambulatory Visit (HOSPITAL_COMMUNITY)
Admission: RE | Admit: 2020-12-20 | Discharge: 2020-12-20 | Disposition: A | Discharge status: Home / Self Care | Payer: Self-pay | Source: Ambulatory Visit | Attending: Physician Assistant | Admitting: Physician Assistant

## 2020-12-20 DIAGNOSIS — R109 Unspecified abdominal pain: Secondary | ICD-10-CM | POA: Insufficient documentation

## 2020-12-24 ENCOUNTER — Ambulatory Visit: Payer: Self-pay | Admitting: Physician Assistant

## 2020-12-31 ENCOUNTER — Encounter: Payer: Self-pay | Admitting: Physician Assistant

## 2020-12-31 ENCOUNTER — Ambulatory Visit: Payer: Self-pay | Admitting: Physician Assistant

## 2020-12-31 DIAGNOSIS — R7303 Prediabetes: Secondary | ICD-10-CM

## 2020-12-31 DIAGNOSIS — Z789 Other specified health status: Secondary | ICD-10-CM

## 2020-12-31 NOTE — Progress Notes (Unsigned)
There were no vitals taken for this visit.   Subjective:    Patient ID: Dave Gonzales, male    DOB: 09-Nov-1981, 40 y.o.   MRN: [id number]  HPI: Dave Gonzales is a 40 y.o. male presenting on 12/31/2020 for No chief complaint on file.   HPI   This is a telemedicine appointment through [employer] due to coronavirus pandemic.  I connected with  Dave Gonzales on 01/01/21 by a video enabled telemedicine application and verified that I am speaking with the correct person using two identifiers.   I discussed the limitations of evaluation and management by telemedicine. The patient expressed understanding and agreed to proceed.  Pt is in his parked vehicle.  Provider and translator are at office.   Pt is 39yoM with appointment today to review test results.  He says he Got covid vaccination  He is Still seeing doctor about his finger injury.  He says surgery is planned.        Relevant past medical, surgical, family and social history reviewed and updated as indicated. Interim medical history since our last visit reviewed. Allergies and medications reviewed and updated.    No current outpatient medications on file.     Review of Systems  Per HPI unless specifically indicated above     Objective:    There were no vitals taken for this visit.  Wt Readings from Last 3 Encounters:  10/15/20 198 lb (89.8 kg)  02/27/16 198 lb (89.8 kg)    Physical Exam Constitutional:      General: He is not in acute distress.    Appearance: He is not ill-appearing.  HENT:     Head: Normocephalic and atraumatic.  Pulmonary:     Effort: No respiratory distress.  Neurological:     Mental Status: He is alert and oriented to person, place, and time.  Psychiatric:        Attention and Perception: Attention normal.        Speech: Speech normal.        Behavior: Behavior is cooperative.     Results for orders placed or performed during the hospital encounter of 10/23/20   Comprehensive metabolic panel  Result Value Ref Range   Sodium 138 135 - 145 mmol/L   Potassium 3.9 3.5 - 5.1 mmol/L   Chloride 104 98 - 111 mmol/L   CO2 27 22 - 32 mmol/L   Glucose, Bld 106 (H) 70 - 99 mg/dL   BUN 13 6 - 20 mg/dL   Creatinine, Ser 1.63 0.61 - 1.24 mg/dL   Calcium 9.0 8.9 - 84.6 mg/dL   Total Protein 7.1 6.5 - 8.1 g/dL   Albumin 4.0 3.5 - 5.0 g/dL   AST 26 15 - 41 U/L   ALT 43 0 - 44 U/L   Alkaline Phosphatase 65 38 - 126 U/L   Total Bilirubin 1.1 0.3 - 1.2 mg/dL   GFR, Estimated >65 >99 mL/min   Anion gap 7 5 - 15  Hemoglobin A1c  Result Value Ref Range   Hgb A1c MFr Bld 5.8 (H) 4.8 - 5.6 %   Mean Plasma Glucose 119.76 mg/dL  Lipid panel  Result Value Ref Range   Cholesterol 169 0 - 200 mg/dL   Triglycerides 60 <357 mg/dL   HDL 48 >01 mg/dL   Total CHOL/HDL Ratio 3.5 RATIO   VLDL 12 0 - 40 mg/dL   LDL Cholesterol 779 (H) 0 - 99 mg/dL      Assessment & Plan:  Encounter Diagnoses  Name Primary?  . Not proficient in English language Yes  . Prediabetes      -reviewed labs and CT with pt -pt to follow up October.  He is to contact office sooner prn

## 2021-01-01 ENCOUNTER — Encounter: Payer: Self-pay | Admitting: Physician Assistant

## 2021-09-23 ENCOUNTER — Ambulatory Visit: Payer: Self-pay | Admitting: Physician Assistant

## 2021-11-13 IMAGING — CR DG FINGER INDEX 2+V*L*
1 series · 3 of 3 positions shown · non-contrast
Comparison: None.

CLINICAL DATA: Finger injury with pain

EXAM:
LEFT INDEX FINGER 2+V

[Series 1: dg finger index left · 0.14mm/px · 3 of 3 slices shown]
[im 1/3]
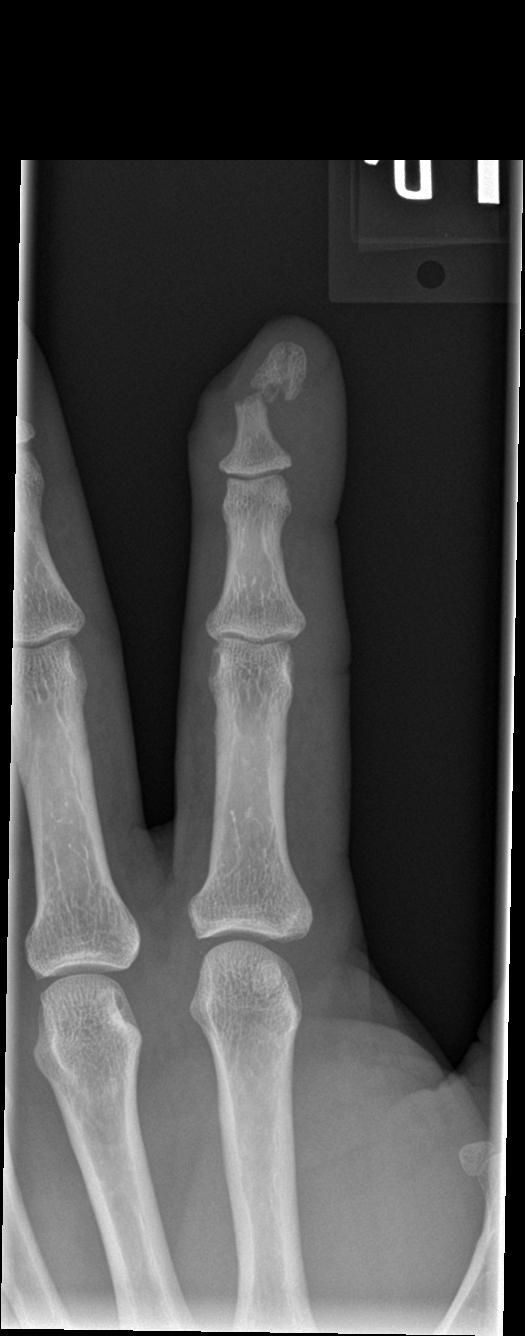
[im 2/3]
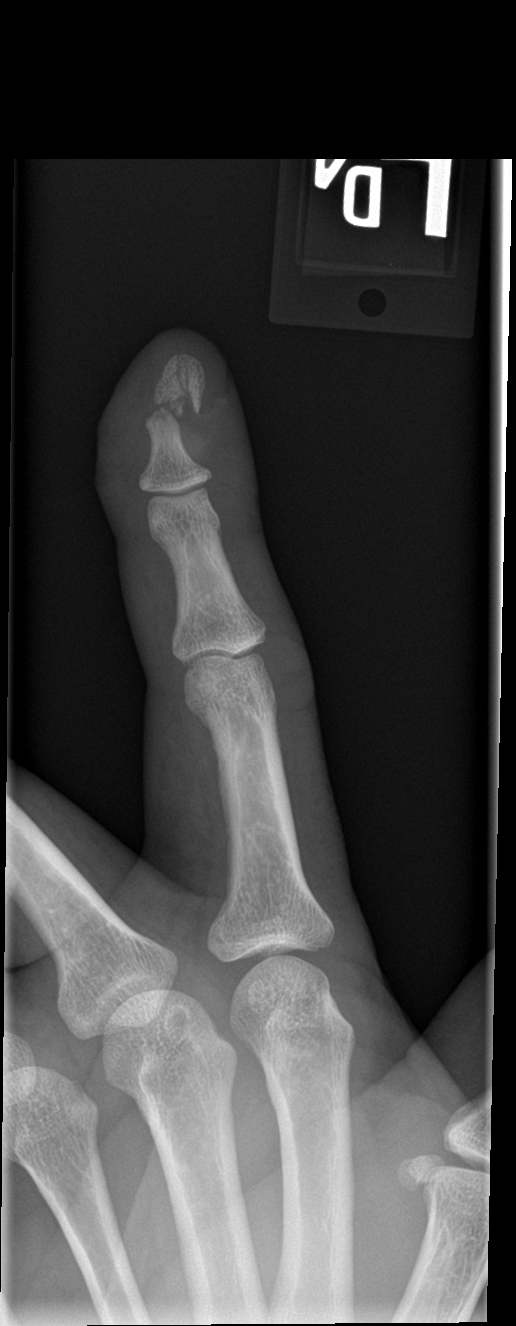
[im 3/3]
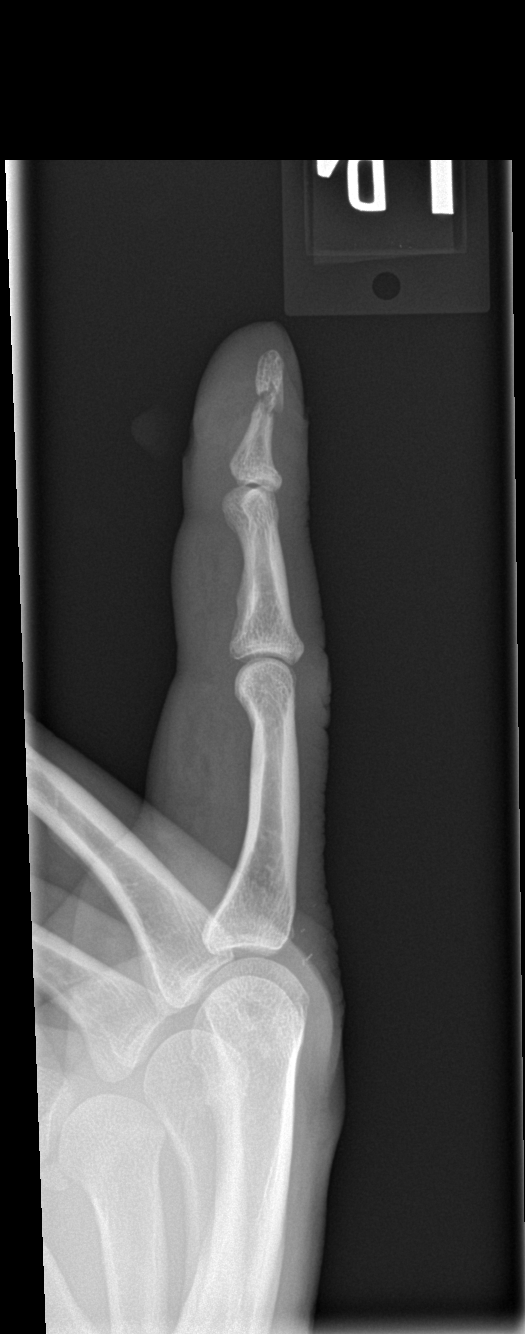

[3 of 3 positions shown; findings below may reference images not displayed]

FINDINGS: Comminuted and displaced fracture of the index finger. No
dislocation or opaque foreign body at the level of fracture. Tiny
punctate foreign body seen dorsal to the first MCP joint on the
lateral view, age-indeterminate
IMPRESSION: 1. Comminuted and displaced tuft fracture of the index finger.
2. Punctate foreign bodies near the index finger MCP joint on the
lateral view, age-indeterminate.

## 2024-08-23 ENCOUNTER — Other Ambulatory Visit: Payer: Self-pay

## 2024-08-23 ENCOUNTER — Encounter (HOSPITAL_COMMUNITY): Payer: Self-pay

## 2024-08-23 ENCOUNTER — Emergency Department (HOSPITAL_COMMUNITY): Payer: Self-pay

## 2024-08-23 ENCOUNTER — Emergency Department (HOSPITAL_COMMUNITY)
Admission: EM | Admit: 2024-08-23 | Discharge: 2024-08-23 | Disposition: A | Discharge status: Home / Self Care | Payer: Self-pay | Attending: Emergency Medicine | Admitting: Emergency Medicine

## 2024-08-23 DIAGNOSIS — E871 Hypo-osmolality and hyponatremia: Secondary | ICD-10-CM | POA: Insufficient documentation

## 2024-08-23 DIAGNOSIS — K602 Anal fissure, unspecified: Secondary | ICD-10-CM | POA: Insufficient documentation

## 2024-08-23 DIAGNOSIS — R739 Hyperglycemia, unspecified: Secondary | ICD-10-CM | POA: Insufficient documentation

## 2024-08-23 DIAGNOSIS — R7401 Elevation of levels of liver transaminase levels: Secondary | ICD-10-CM | POA: Insufficient documentation

## 2024-08-23 LAB — CBC
HCT: 45.5 % (ref 39.0–52.0)
Hemoglobin: 15.7 g/dL (ref 13.0–17.0)
MCH: 30.5 pg (ref 26.0–34.0)
MCHC: 34.5 g/dL (ref 30.0–36.0)
MCV: 88.5 fL (ref 80.0–100.0)
Platelets: 242 K/uL (ref 150–400)
RBC: 5.14 MIL/uL (ref 4.22–5.81)
RDW: 12.1 % (ref 11.5–15.5)
WBC: 8.4 K/uL (ref 4.0–10.5)
nRBC: 0 % (ref 0.0–0.2)

## 2024-08-23 LAB — COMPREHENSIVE METABOLIC PANEL WITH GFR
ALT: 75 U/L — ABNORMAL HIGH (ref 0–44)
AST: 48 U/L — ABNORMAL HIGH (ref 15–41)
Albumin: 3.3 g/dL — ABNORMAL LOW (ref 3.5–5.0)
Alkaline Phosphatase: 86 U/L (ref 38–126)
Anion gap: 11 (ref 5–15)
BUN: 11 mg/dL (ref 6–20)
CO2: 23 mmol/L (ref 22–32)
Calcium: 8.8 mg/dL — ABNORMAL LOW (ref 8.9–10.3)
Chloride: 99 mmol/L (ref 98–111)
Creatinine, Ser: 0.71 mg/dL (ref 0.61–1.24)
GFR, Estimated: >60 mL/min (ref >60)
Glucose, Bld: 209 mg/dL — ABNORMAL HIGH (ref 70–99)
Potassium: 3.8 mmol/L (ref 3.5–5.1)
Sodium: 133 mmol/L — ABNORMAL LOW (ref 135–145)
Total Bilirubin: 0.6 mg/dL (ref 0.0–1.2)
Total Protein: 6.5 g/dL (ref 6.5–8.1)

## 2024-08-23 MED ORDER — HYDROCODONE-ACETAMINOPHEN 5-325 MG PO TABS
1.0000 | ORAL_TABLET | Freq: Four times a day (QID) | ORAL | 0 refills | Status: AC | PRN
Start: 2024-08-23 — End: ?

## 2024-08-23 MED ORDER — MORPHINE SULFATE (PF) 4 MG/ML IV SOLN
4.0000 mg | Freq: Once | INTRAVENOUS | Status: AC
Start: 2024-08-23 — End: 2024-08-23
  Administered 2024-08-23: 4 mg via INTRAVENOUS
  Filled 2024-08-23: qty 1

## 2024-08-23 MED ORDER — IOHEXOL 350 MG/ML SOLN
75.0000 mL | Freq: Once | INTRAVENOUS | Status: AC | PRN
Start: 2024-08-23 — End: 2024-08-23
  Administered 2024-08-23: 75 mL via INTRAVENOUS

## 2024-08-23 MED ORDER — HYDROCORTISONE (PERIANAL) 2.5 % EX CREA
1.0000 | TOPICAL_CREAM | Freq: Two times a day (BID) | CUTANEOUS | 0 refills | Status: AC
Start: 2024-08-23 — End: ?

## 2024-08-23 MED ORDER — LIDOCAINE-PRILOCAINE 2.5-2.5 % EX CREA: 1.0000 | TOPICAL_CREAM | CUTANEOUS | 1 refills | Status: AC | PRN

## 2024-08-23 NOTE — Discharge Instructions (Addendum)
 Your CT another workup was overall reassuring.  Your blood glucose was slightly elevated, if you have never been worked up for diabetes I recommend establishing care with a primary care doctor so that you can talk to them about your elevated blood sugar.  I suspect you either have a thrombosed hemorrhoid that I cannot visualize or an anal fissure, these are both painful rectal conditions that can resolve on their own over time, but occasionally require surgical management, I have placed contact information for a surgeon on your paperwork above.  Please give them a call to schedule a follow-up appointment.  I have prescribed 2 creams, 1 is a lidocaine  numbing cream and the other 1 is a steroid cream to help reduce inflammation in the rectum.   Please use Tylenol  or ibuprofen for pain.  You may use 600 mg ibuprofen every 6 hours or 1000 mg of Tylenol  every 6 hours.  You may choose to alternate between the 2.  This would be most effective.  Not to exceed 4 g of Tylenol  within 24 hours.  Not to exceed 3200 mg ibuprofen 24 hours.  You can use the stronger narcotic pain medication in place of Tylenol  for severe break through pain.  If you take the narcotic pain medication that we prescribed recommend that you also take a laxative such as MiraLAX or Dulcolax every day that you take the narcotic pain medicine, and drink plenty of fluids, 50 to 64 ounces to prevent any constipation.  I provided instructions on how to take a sitz bath which you can do 2-4 times a day to help with the rectal discomfort.  Su tomografa computarizada y [employer], en general, tranquilizadores. Su nivel de glucosa en sangre estaba ligeramente elevado. Si nunca se ha sometido a estudios para Engineer, manufacturing diabetes, le recomiendo que consulte con un mdico de cabecera para que pueda hablar con l sobre su nivel elevado de Production assistant, radio. Sospecho que tiene una hemorroide trombosada que no puedo visualizar o una fisura anal. Ambas  son afecciones rectales dolorosas que pueden resolverse por s solas con el tiempo, pero que en ocasiones requieren tratamiento quirrgico. He incluido la informacin de contacto de un cirujano en su documentacin anterior. Por favor, llmelos para programar una cita de seguimiento. Le he recetado dos cremas: una anestsica con lidocana y la otra con esteroides para ayudar a reducir la inflamacin del recto.  Para el dolor, use Tylenol  o ibuprofeno. Puede usar 600 mg de ibuprofeno cada 6 horas o 1000 mg de Tylenol  cada 6 horas. Puede alternar [employer]. Esto sera ms efectivo. No exceda los 4 g de Tylenol  en 24 horas. No exceda los 3200 mg de ibuprofeno en 24 horas.  Puede usar el analgsico narctico ms fuerte en lugar de Tylenol  para el dolor intenso.  Si toma el analgsico Management consultant, le recomendamos que tambin tome un laxante como MiraLAX o Dulcolax todos los das que tome el analgsico narctico y que beba abundante lquido (de 1,5 litros a 1,9 litros) para Banker.  Le di instrucciones sobre cmo tomar un bao de asiento, que puede Education officer, environmental de 2 a 4 veces al da para Paramedic las [employer].

## 2024-08-23 NOTE — ED Notes (Signed)
 Patient dc by RN. Patient verbalizes understanding of dc instructions without additional questions. With family to lobby.

## 2024-08-23 NOTE — ED Notes (Signed)
 Patient transported to CT

## 2024-08-23 NOTE — ED Triage Notes (Signed)
 Pt arrived from home via POV. Pt states that on Friday while having a BM something popped in his rectum and has been having burning pain with each bm after. 10/10 on pain scale.

## 2024-08-23 NOTE — ED Provider Notes (Signed)
 [place] [facility]PART[facility] AT [facility] Provider Note   CSN: [id number] Arrival date & time: 08/23/24  9451     Patient presents with: Rectal Pain   Dave Gonzales is a 43 y.o. male with overall noncontributory past medical history who presents concern for severe rectal burning for the last 4 to 5 days.  Patient reports that he had a bowel movement, with straining, felt a tearing or popping.  Patient reports some blood with wiping.  He denies any drainage, purulence.  No history of rectal abscess, no receptive anal sex, no fever, chills.  Rates pain 10/10.   HPI     Prior to Admission medications   Medication Sig Start Date End Date Taking? Authorizing Provider  HYDROcodone-acetaminophen  (NORCO/VICODIN) 5-325 MG tablet Take 1 tablet by mouth every 6 (six) hours as needed. 08/23/24  Yes Manar Smalling H, PA-C  hydrocortisone (ANUSOL-HC) 2.5 % rectal cream Place 1 Application rectally 2 (two) times daily. 08/23/24  Yes Douglas Rooks H, PA-C  lidocaine -prilocaine (EMLA) cream Apply 1 Application topically as needed. 08/23/24  Yes Kalifa Cadden H, PA-C    Allergies: Patient has no known allergies.    Review of Systems  All other systems reviewed and are negative.   Updated Vital Signs BP (!) 127/90 (BP Location: Left Arm)   Pulse 70   Temp 98.3 F (36.8 C)   Resp 16   Ht 5' 8 (1.727 m)   Wt 88.9 kg   SpO2 99%   BMI 29.80 kg/m   Physical Exam Vitals and nursing note reviewed.  Constitutional:      General: He is not in acute distress.    Appearance: Normal appearance.  HENT:     Head: Normocephalic and atraumatic.  Eyes:     General:        Right eye: No discharge.        Left eye: No discharge.  Cardiovascular:     Rate and Rhythm: Normal rate and regular rhythm.     Heart sounds: No murmur heard.    No friction rub. No gallop.  Pulmonary:     Effort: Pulmonary effort is normal.     Breath sounds: Normal breath sounds.   Abdominal:     General: Bowel sounds are normal.     Palpations: Abdomen is soft.  Genitourinary:    Comments: No obvious rectal fissure, hemorrhoids on external rectal exam. Patient cannot tolerate rectal exam, question internal mass vs fissure on exam but difficult to assess due to pain level Skin:    General: Skin is warm and dry.     Capillary Refill: Capillary refill takes less than 2 seconds.  Neurological:     Mental Status: He is alert and oriented to person, place, and time.  Psychiatric:        Mood and Affect: Mood normal.        Behavior: Behavior normal.     (all labs ordered are listed, but only abnormal results are displayed) Labs Reviewed  COMPREHENSIVE METABOLIC PANEL WITH GFR - Abnormal; Notable for the following components:      Result Value   Sodium 133 (*)    Glucose, Bld 209 (*)    Calcium 8.8 (*)    Albumin 3.3 (*)    AST 48 (*)    ALT 75 (*)    All other components within normal limits  CBC    EKG: None  Radiology: CT ABDOMEN PELVIS W CONTRAST Result Date: 08/23/2024 CLINICAL  DATA:  Abdominal pain, acute, nonlocalized rule out rectal abscess EXAM: CT ABDOMEN AND PELVIS WITH CONTRAST TECHNIQUE: Multidetector CT imaging of the abdomen and pelvis was performed using the standard protocol following bolus administration of intravenous contrast. RADIATION DOSE REDUCTION: This exam was performed according to the departmental dose-optimization program which includes automated exposure control, adjustment of the mA and/or kV according to patient size and/or use of iterative reconstruction technique. CONTRAST:  75mL OMNIPAQUE IOHEXOL 350 MG/ML SOLN COMPARISON:  December 20, 2020 FINDINGS: Lower chest: No focal airspace consolidation or pleural effusion.Posterior bibasilar dependent atelectasis. Hepatobiliary: No mass.Diffuse hepatic steatosis.No radiopaque stones or wall thickening of the gallbladder. No intrahepatic or extrahepatic biliary ductal dilation. The  portal veins are patent. Pancreas: No mass or main ductal dilation. No peripancreatic inflammation or fluid collection. Spleen: Normal size. No mass. Adrenals/Urinary Tract: No adrenal masses. No renal mass. No nephrolithiasis or hydronephrosis. The urinary bladder is distended without focal abnormality. Stomach/Bowel: The stomach is decompressed without focal abnormality. No small bowel wall thickening or inflammation. No small bowel obstruction.Normal appendix. Vascular/Lymphatic: No aortic aneurysm. No intraabdominal or pelvic lymphadenopathy. Reproductive: No prostatomegaly.No free pelvic fluid. Other: No pneumoperitoneum, ascites, or mesenteric inflammation. Musculoskeletal: No acute fracture or destructive lesion. Multilevel thoracic osteophytosis. IMPRESSION: 1. No acute intra-abdominal or pelvic abnormality. 2. Diffuse hepatic steatosis. Electronically Signed   By: Rogelia Myers M.D.   On: 08/23/2024 08:54     Procedures   Medications Ordered in the ED  morphine (PF) 4 MG/ML injection 4 mg (4 mg Intravenous Given 08/23/24 0653)  iohexol (OMNIPAQUE) 350 MG/ML injection 75 mL (75 mLs Intravenous Contrast Given 08/23/24 0804)                                    Medical Decision Making  This patient is a 43 y.o. male  who presents to the ED for concern of rectal pain.   Differential diagnoses prior to evaluation: The emergent differential diagnosis includes, but is not limited to, rectal abscess, hemorrhoid, rectal fissure, versus other. This is not an exhaustive differential.   Past Medical History / Co-morbidities / Social History: Overall noncontributory  Physical Exam: Physical exam performed. The pertinent findings include: No obvious rectal fissure, hemorrhoids on external rectal exam. Patient cannot tolerate rectal exam, question internal mass vs fissure on exam but difficult to assess due to pain level  Vital signs overall stable other than some diastolic hypertension, blood  pressure 136/101 on arrival.  Lab Tests/Imaging studies: I personally interpreted labs/imaging and the pertinent results include: CBC unremarkable, CMP notable for mild hyponatremia, sodium 133, mild hyperglycemia, glucose 209, mildly elevated AST, ALT, 48, 75, normal total bilirubin.  I independently interpreted CT abdomen pelvis with contrast which shows no evidence of acute intra-abdominal abnormality. I agree with the radiologist interpretation.  Medications: I ordered medication including morphine for pain.  I have reviewed the patients home medicines and have made adjustments as needed.   Disposition: After consideration of the diagnostic results and the patients response to treatment, I feel that no visualized hemorrhoid, suspect subtle rectal fissure in context of no CT findings, no rectal abscess, encouraged lidocaine , pain medicine, sitz bath's, surgery follow-up.   emergency department workup does not suggest an emergent condition requiring admission or immediate intervention beyond what has been performed at this time. The plan is: Patient to follow-up with surgery, return precautions as above.. The patient is safe for discharge  and has been instructed to return immediately for worsening symptoms, change in symptoms or any other concerns.   Final diagnoses:  Anal fissure  Hyperglycemia    ED Discharge Orders          Ordered    lidocaine -prilocaine (EMLA) cream  As needed        08/23/24 0911    hydrocortisone (ANUSOL-HC) 2.5 % rectal cream  2 times daily        08/23/24 0911    HYDROcodone-acetaminophen  (NORCO/VICODIN) 5-325 MG tablet  Every 6 hours PRN        08/23/24 0911               Valen Mascaro H, PA-C 08/23/24 [id number]    Jerral Meth, MD 08/23/24 2300

## 2024-09-01 ENCOUNTER — Telehealth: Payer: Self-pay

## 2024-09-01 NOTE — Telephone Encounter (Signed)
 Made first attempt to f/u with pt by phone with the assistance of [employer], however the pt was unavailable at the time of the call with a voicemail message left  at the time    This call was in attempt to provide nurse case management to ensure pt did not have any additional questions or concerns regarding any post hospital instruction,medical concerns, obtaining her medications and serving as a reminder of any upcoming appointments.    In addition this call served as a reminder for pt to reconnect back with the [employer] [facility] to become re-enrolled since last expiring with their program 10.6.23.  This will be assist the patient with getting re-connected back with a PCP  Will attempt to make additional phone contact, if pt does attempt not to return call first
